# Patient Record
Sex: Female | Born: 1960 | Race: White | Hispanic: No | Marital: Married | Smoking: Never smoker
Health system: Southern US, Community
[De-identification: ages and names within clinical notes are randomized; demographics above are authoritative.]

## PROBLEM LIST (undated history)

## (undated) DIAGNOSIS — C73 Malignant neoplasm of thyroid gland: Secondary | ICD-10-CM

## (undated) HISTORY — DX: Malignant neoplasm of thyroid gland: C73

---

## 2006-01-10 ENCOUNTER — Other Ambulatory Visit: Admission: RE | Admit: 2006-01-10 | Discharge: 2006-01-10 | Payer: Self-pay | Admitting: Family Medicine

## 2006-02-06 ENCOUNTER — Encounter (INDEPENDENT_AMBULATORY_CARE_PROVIDER_SITE_OTHER): Payer: Self-pay | Admitting: *Deleted

## 2006-02-06 ENCOUNTER — Other Ambulatory Visit: Admission: RE | Admit: 2006-02-06 | Discharge: 2006-02-06 | Payer: Self-pay | Admitting: Interventional Radiology

## 2006-02-06 ENCOUNTER — Encounter: Admission: RE | Admit: 2006-02-06 | Discharge: 2006-02-06 | Payer: Self-pay | Admitting: General Surgery

## 2008-10-20 ENCOUNTER — Encounter: Admission: RE | Admit: 2008-10-20 | Discharge: 2008-10-20 | Payer: Self-pay | Admitting: Internal Medicine

## 2008-12-23 ENCOUNTER — Encounter (INDEPENDENT_AMBULATORY_CARE_PROVIDER_SITE_OTHER): Payer: Self-pay | Admitting: Interventional Radiology

## 2008-12-23 ENCOUNTER — Encounter: Admission: RE | Admit: 2008-12-23 | Discharge: 2008-12-23 | Payer: Self-pay | Admitting: Internal Medicine

## 2008-12-23 ENCOUNTER — Other Ambulatory Visit: Admission: RE | Admit: 2008-12-23 | Discharge: 2008-12-23 | Payer: Self-pay | Admitting: Interventional Radiology

## 2010-01-19 ENCOUNTER — Observation Stay (HOSPITAL_COMMUNITY): Admission: RE | Admit: 2010-01-19 | Discharge: 2010-01-20 | Payer: Self-pay | Admitting: Surgery

## 2010-01-19 ENCOUNTER — Encounter (INDEPENDENT_AMBULATORY_CARE_PROVIDER_SITE_OTHER): Payer: Self-pay | Admitting: Surgery

## 2010-04-19 ENCOUNTER — Encounter (HOSPITAL_COMMUNITY): Admission: RE | Admit: 2010-04-19 | Discharge: 2010-07-01 | Payer: Self-pay | Admitting: Internal Medicine

## 2010-10-10 DIAGNOSIS — C73 Malignant neoplasm of thyroid gland: Secondary | ICD-10-CM

## 2010-10-10 HISTORY — PX: TOTAL THYROIDECTOMY: SHX2547

## 2010-10-10 HISTORY — DX: Malignant neoplasm of thyroid gland: C73

## 2010-10-31 ENCOUNTER — Encounter: Payer: Self-pay | Admitting: Internal Medicine

## 2010-11-05 ENCOUNTER — Encounter
Admission: RE | Admit: 2010-11-05 | Discharge: 2010-11-05 | Payer: Self-pay | Source: Home / Self Care | Attending: Internal Medicine | Admitting: Internal Medicine

## 2010-11-12 ENCOUNTER — Other Ambulatory Visit (HOSPITAL_COMMUNITY): Payer: Self-pay | Admitting: Internal Medicine

## 2010-11-12 DIAGNOSIS — C73 Malignant neoplasm of thyroid gland: Secondary | ICD-10-CM

## 2010-12-13 ENCOUNTER — Ambulatory Visit (HOSPITAL_COMMUNITY)
Admission: RE | Admit: 2010-12-13 | Discharge: 2010-12-13 | Disposition: A | Discharge status: Home / Self Care | Payer: BC Managed Care – PPO | Source: Ambulatory Visit | Attending: Internal Medicine | Admitting: Internal Medicine

## 2010-12-13 DIAGNOSIS — C73 Malignant neoplasm of thyroid gland: Secondary | ICD-10-CM

## 2010-12-13 DIAGNOSIS — Z85850 Personal history of malignant neoplasm of thyroid: Secondary | ICD-10-CM | POA: Insufficient documentation

## 2010-12-14 ENCOUNTER — Ambulatory Visit (HOSPITAL_COMMUNITY)
Admission: RE | Admit: 2010-12-14 | Discharge: 2010-12-14 | Disposition: A | Discharge status: Home / Self Care | Payer: BC Managed Care – PPO | Source: Ambulatory Visit | Attending: Internal Medicine | Admitting: Internal Medicine

## 2010-12-15 ENCOUNTER — Ambulatory Visit (HOSPITAL_COMMUNITY): Payer: BC Managed Care – PPO | Attending: Internal Medicine

## 2010-12-17 ENCOUNTER — Ambulatory Visit (HOSPITAL_COMMUNITY)
Admission: RE | Admit: 2010-12-17 | Discharge: 2010-12-17 | Disposition: A | Discharge status: Home / Self Care | Payer: BC Managed Care – PPO | Source: Ambulatory Visit | Attending: Internal Medicine | Admitting: Internal Medicine

## 2010-12-17 DIAGNOSIS — Z85850 Personal history of malignant neoplasm of thyroid: Secondary | ICD-10-CM | POA: Insufficient documentation

## 2010-12-17 MED ORDER — SODIUM IODIDE I 131 CAPSULE
4.0000 | Freq: Once | INTRAVENOUS | Status: AC | PRN
  Administered 2010-12-16: 4 via ORAL

## 2010-12-29 LAB — CALCIUM: Calcium: 8.9 mg/dL (ref 8.4–10.5)

## 2010-12-29 LAB — CBC
HCT: 39.2 % (ref 36.0–46.0)
Hemoglobin: 13.4 g/dL (ref 12.0–15.0)
WBC: 5.8 10*3/uL (ref 4.0–10.5)

## 2010-12-29 LAB — DIFFERENTIAL
Eosinophils Relative: 1 % (ref 0–5)
Lymphocytes Relative: 33 % (ref 12–46)
Lymphs Abs: 1.9 10*3/uL (ref 0.7–4.0)
Monocytes Absolute: 0.4 10*3/uL (ref 0.1–1.0)

## 2010-12-29 LAB — URINALYSIS, ROUTINE W REFLEX MICROSCOPIC
Bilirubin Urine: NEGATIVE
Glucose, UA: NEGATIVE mg/dL
Hgb urine dipstick: NEGATIVE
Specific Gravity, Urine: 1.015 (ref 1.005–1.030)
Urobilinogen, UA: 0.2 mg/dL (ref 0.0–1.0)

## 2010-12-29 LAB — BASIC METABOLIC PANEL
Chloride: 106 mEq/L (ref 96–112)
GFR calc non Af Amer: >60 mL/min (ref >60)
Glucose, Bld: 89 mg/dL (ref 70–99)
Potassium: 4 mEq/L (ref 3.5–5.1)
Sodium: 139 mEq/L (ref 135–145)

## 2011-03-09 ENCOUNTER — Other Ambulatory Visit: Payer: Self-pay | Admitting: Internal Medicine

## 2011-03-09 DIAGNOSIS — C73 Malignant neoplasm of thyroid gland: Secondary | ICD-10-CM

## 2011-06-22 ENCOUNTER — Ambulatory Visit
Admission: RE | Admit: 2011-06-22 | Discharge: 2011-06-22 | Disposition: A | Discharge status: Home / Self Care | Payer: BC Managed Care – PPO | Source: Ambulatory Visit | Attending: Internal Medicine | Admitting: Internal Medicine

## 2011-06-22 DIAGNOSIS — C73 Malignant neoplasm of thyroid gland: Secondary | ICD-10-CM

## 2011-07-08 ENCOUNTER — Other Ambulatory Visit: Payer: Self-pay | Admitting: Internal Medicine

## 2011-07-08 DIAGNOSIS — E041 Nontoxic single thyroid nodule: Secondary | ICD-10-CM

## 2011-07-19 ENCOUNTER — Ambulatory Visit
Admission: RE | Admit: 2011-07-19 | Discharge: 2011-07-19 | Disposition: A | Discharge status: Home / Self Care | Payer: BC Managed Care – PPO | Source: Ambulatory Visit | Attending: Internal Medicine | Admitting: Internal Medicine

## 2011-07-19 ENCOUNTER — Other Ambulatory Visit (HOSPITAL_COMMUNITY)
Admission: RE | Admit: 2011-07-19 | Discharge: 2011-07-19 | Disposition: A | Discharge status: Home / Self Care | Payer: BC Managed Care – PPO | Source: Ambulatory Visit | Attending: Interventional Radiology | Admitting: Interventional Radiology

## 2011-07-19 DIAGNOSIS — E049 Nontoxic goiter, unspecified: Secondary | ICD-10-CM | POA: Insufficient documentation

## 2011-07-19 DIAGNOSIS — E041 Nontoxic single thyroid nodule: Secondary | ICD-10-CM

## 2011-07-19 IMAGING — US US THYROID BIOPSY
1 series · 12 of 12 positions shown · non-contrast
Comparison: none

CLINICAL DATA: Residual/recurrent tissue in the left thyroidectomy
bed.

ULTRASOUND-GUIDED THYROID ASPIRATION BIOPSY
TECHNIQUE: Survey ultrasound was performed and the
residual/recurrent tissue in the left thyroidectomy bed was
localized.  An appropriate skin entry site was determined.  Skin
was marked, then prepped with Betadine, draped in usual sterile
fashion, and infiltrated locally with 1% lidocaine.  Under real-
time ultrasound guidance, 4  passes were made into the region with
25 gauge needles.  The patient tolerated procedure well, with no
immediate complications.
IMPRESSION
1.  Technically successful ultrasound-guided left thyroidectomy bed
aspiration biopsy

[Series 1: us thyroid biopsy · 0.08mm/px · 12 acquisitions, 12 frames shown]
[im 1/12]
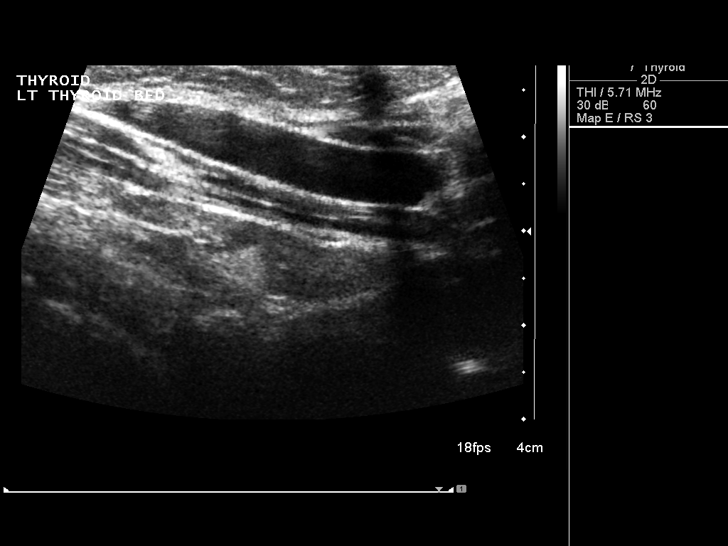
[im 2/12]
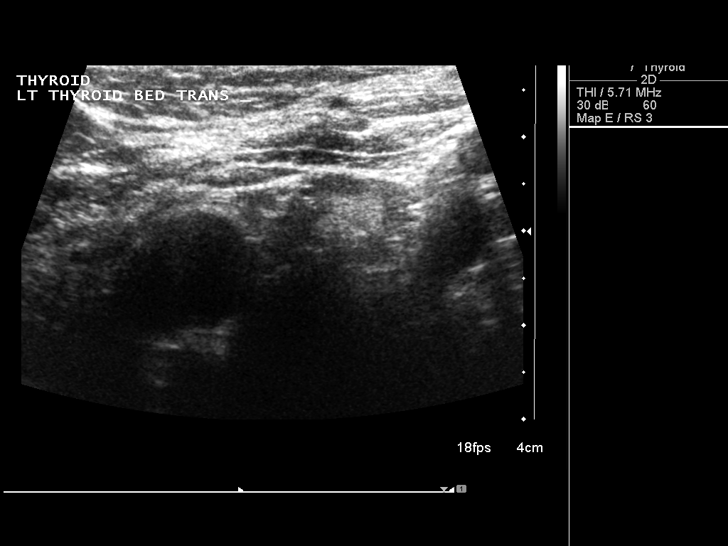
[im 3/12]
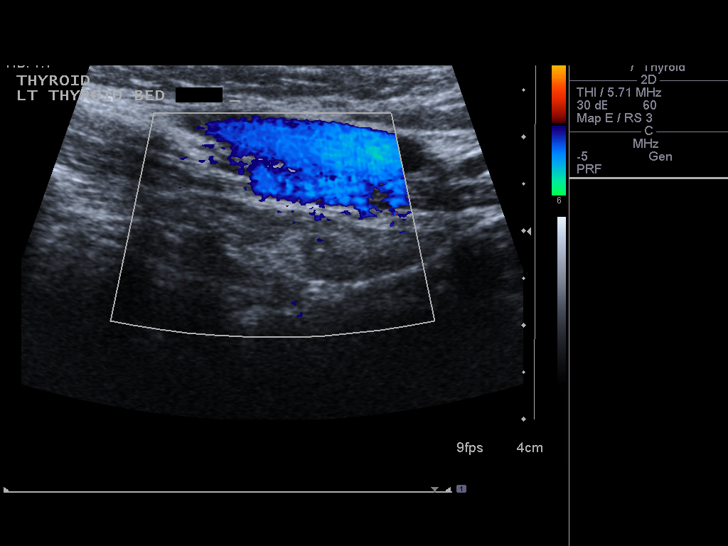
[im 4/12]
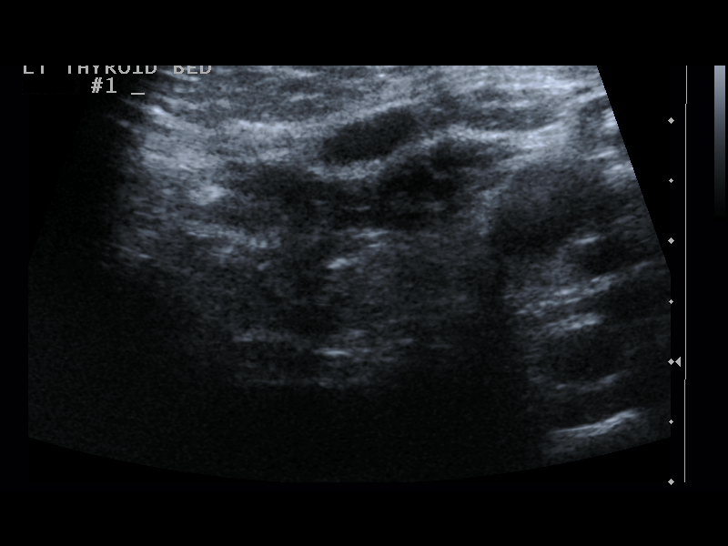
[im 5/12]
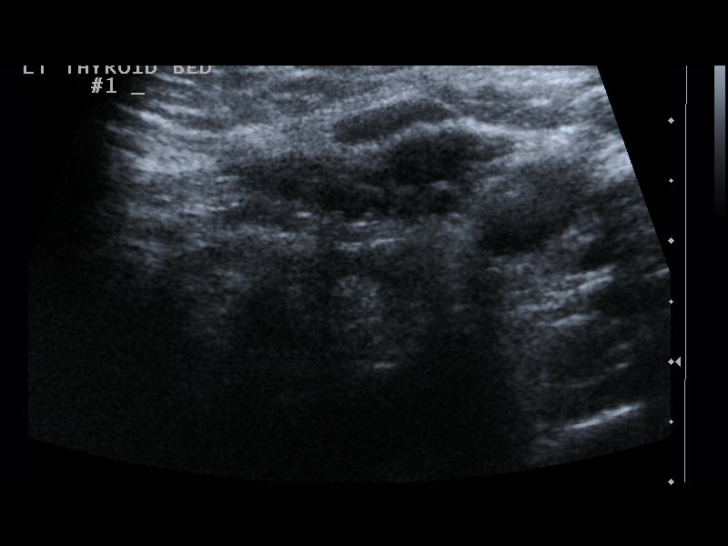
[im 6/12]
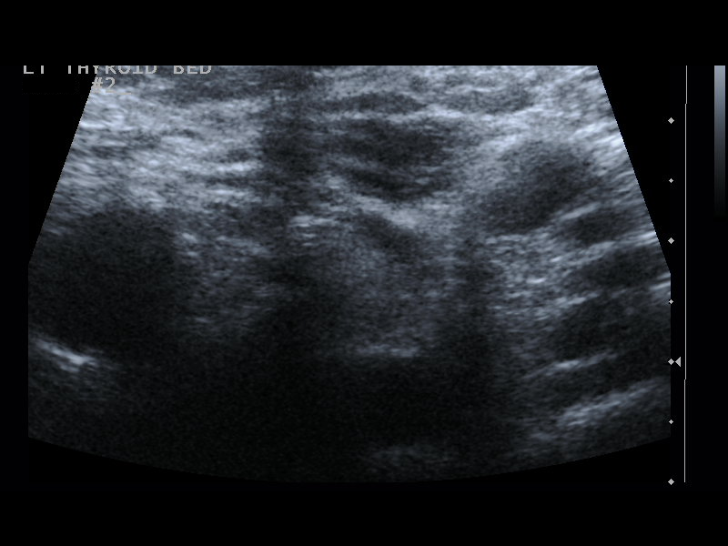
[im 7/12]
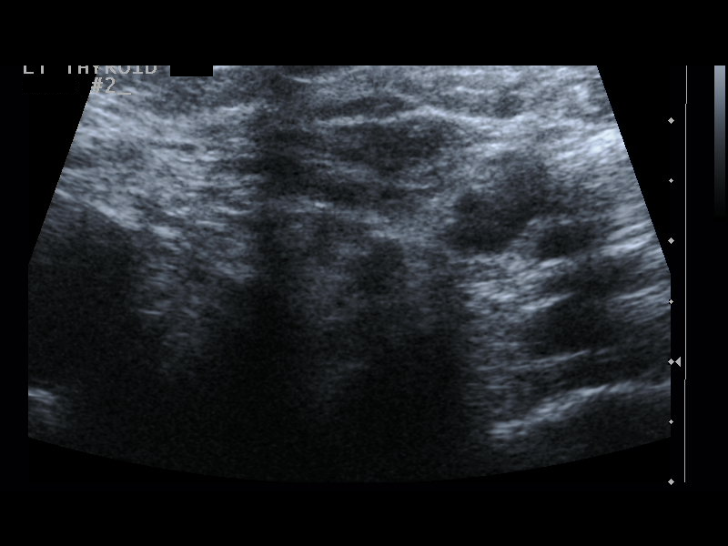
[im 8/12]
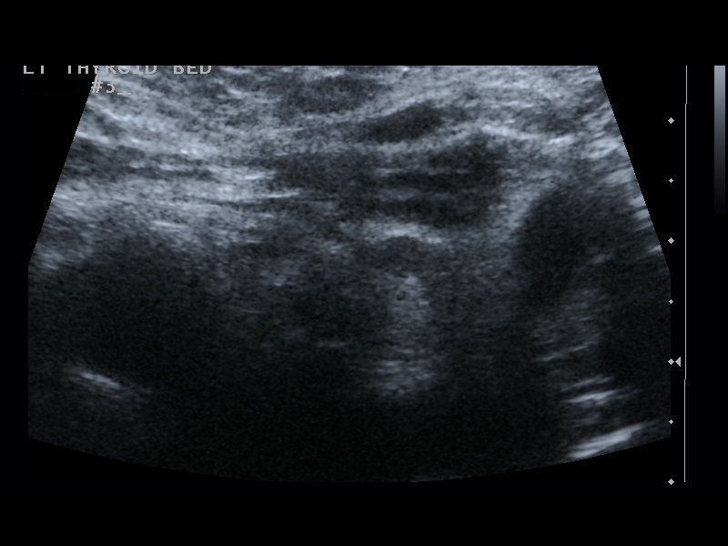
[im 9/12]
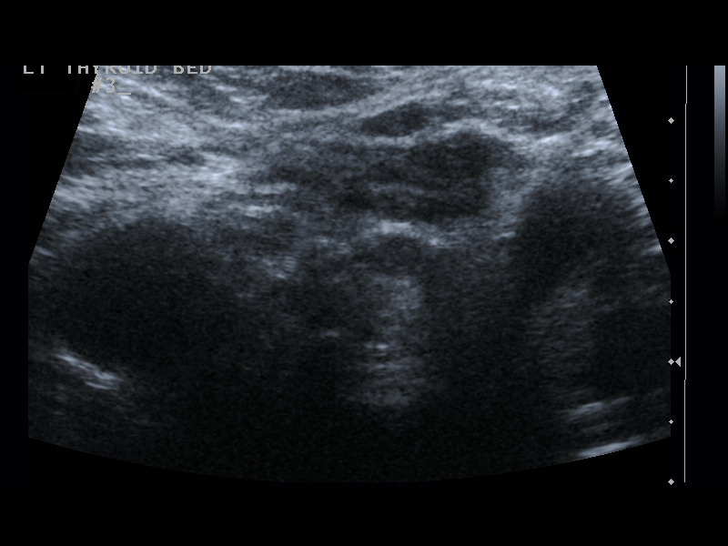
[im 10/12]
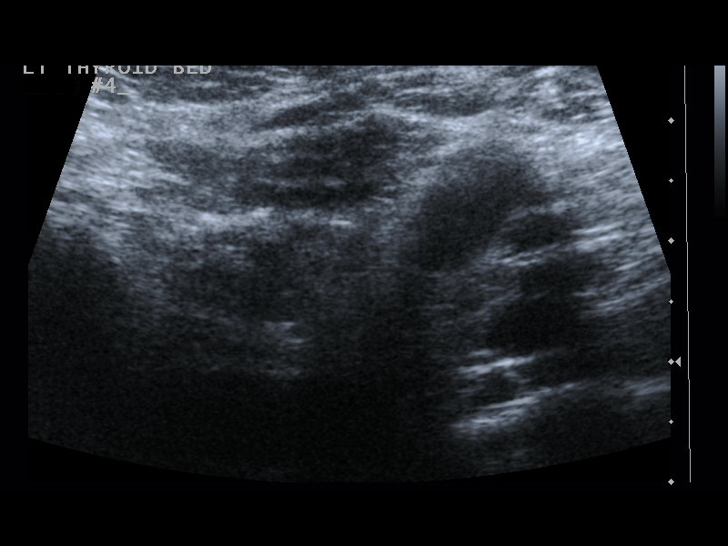
[im 11/12]
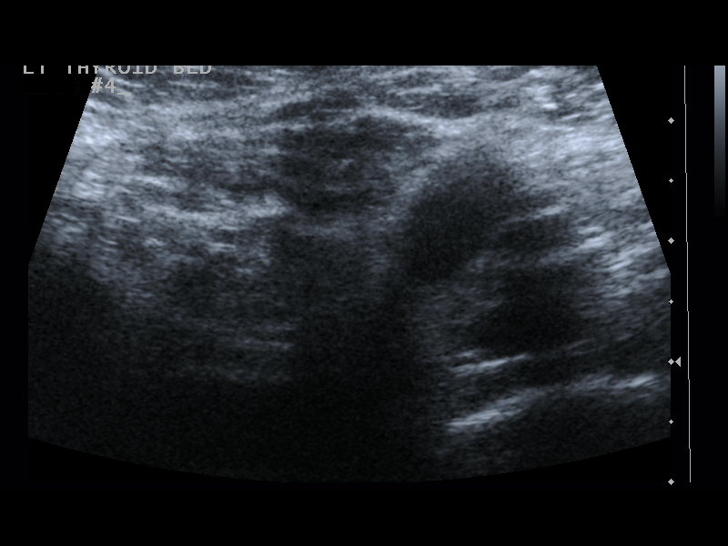
[im 12/12]
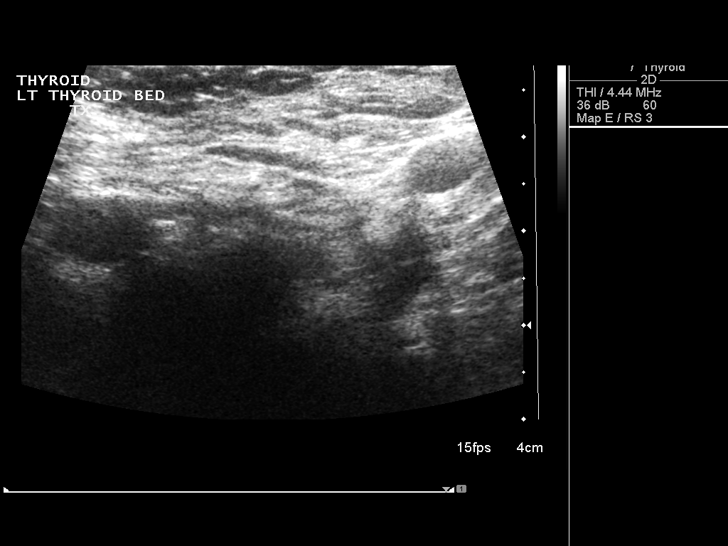

[12 of 12 positions shown; findings below may reference images not displayed]

## 2011-08-04 ENCOUNTER — Telehealth (INDEPENDENT_AMBULATORY_CARE_PROVIDER_SITE_OTHER): Payer: Self-pay

## 2011-08-04 NOTE — Telephone Encounter (Signed)
Return call to patient in hopes of getting more information re: ultrasound.  Left message for patient to please ask for me or leave more detail information about the matter.

## 2011-08-17 ENCOUNTER — Encounter (INDEPENDENT_AMBULATORY_CARE_PROVIDER_SITE_OTHER): Payer: Self-pay | Admitting: Surgery

## 2011-08-17 DIAGNOSIS — C73 Malignant neoplasm of thyroid gland: Secondary | ICD-10-CM | POA: Insufficient documentation

## 2012-03-08 ENCOUNTER — Other Ambulatory Visit: Payer: Self-pay | Admitting: Internal Medicine

## 2012-03-08 DIAGNOSIS — C73 Malignant neoplasm of thyroid gland: Secondary | ICD-10-CM

## 2012-03-09 ENCOUNTER — Ambulatory Visit
Admission: RE | Admit: 2012-03-09 | Discharge: 2012-03-09 | Disposition: A | Discharge status: Home / Self Care | Payer: BC Managed Care – PPO | Source: Ambulatory Visit | Attending: Internal Medicine | Admitting: Internal Medicine

## 2012-03-09 DIAGNOSIS — C73 Malignant neoplasm of thyroid gland: Secondary | ICD-10-CM

## 2012-03-28 ENCOUNTER — Encounter (INDEPENDENT_AMBULATORY_CARE_PROVIDER_SITE_OTHER): Payer: Self-pay | Admitting: Surgery

## 2012-09-24 ENCOUNTER — Other Ambulatory Visit: Payer: Self-pay | Admitting: Internal Medicine

## 2012-09-24 DIAGNOSIS — R221 Localized swelling, mass and lump, neck: Secondary | ICD-10-CM

## 2012-10-02 ENCOUNTER — Ambulatory Visit
Admission: RE | Admit: 2012-10-02 | Discharge: 2012-10-02 | Disposition: A | Discharge status: Home / Self Care | Payer: BC Managed Care – PPO | Source: Ambulatory Visit | Attending: Internal Medicine | Admitting: Internal Medicine

## 2012-10-02 DIAGNOSIS — R221 Localized swelling, mass and lump, neck: Secondary | ICD-10-CM

## 2012-10-11 ENCOUNTER — Telehealth (INDEPENDENT_AMBULATORY_CARE_PROVIDER_SITE_OTHER): Payer: Self-pay

## 2012-10-11 NOTE — Telephone Encounter (Signed)
Per Dr Ardine Eng request pt notified of u/s result.

## 2013-07-23 ENCOUNTER — Other Ambulatory Visit: Payer: Self-pay | Admitting: Internal Medicine

## 2013-07-23 DIAGNOSIS — C73 Malignant neoplasm of thyroid gland: Secondary | ICD-10-CM

## 2013-08-08 ENCOUNTER — Ambulatory Visit
Admission: RE | Admit: 2013-08-08 | Discharge: 2013-08-08 | Disposition: A | Discharge status: Home / Self Care | Payer: BC Managed Care – PPO | Source: Ambulatory Visit | Attending: Internal Medicine | Admitting: Internal Medicine

## 2013-08-08 DIAGNOSIS — C73 Malignant neoplasm of thyroid gland: Secondary | ICD-10-CM

## 2014-09-09 ENCOUNTER — Other Ambulatory Visit: Payer: Self-pay | Admitting: Internal Medicine

## 2014-09-09 DIAGNOSIS — C73 Malignant neoplasm of thyroid gland: Secondary | ICD-10-CM

## 2014-09-29 ENCOUNTER — Ambulatory Visit
Admission: RE | Admit: 2014-09-29 | Discharge: 2014-09-29 | Disposition: A | Discharge status: Home / Self Care | Source: Ambulatory Visit | Attending: Internal Medicine | Admitting: Internal Medicine

## 2014-09-29 DIAGNOSIS — C73 Malignant neoplasm of thyroid gland: Secondary | ICD-10-CM

## 2016-05-23 ENCOUNTER — Ambulatory Visit (INDEPENDENT_AMBULATORY_CARE_PROVIDER_SITE_OTHER): Admitting: Neurology

## 2016-05-23 ENCOUNTER — Telehealth: Payer: Self-pay | Admitting: Neurology

## 2016-05-23 ENCOUNTER — Encounter: Payer: Self-pay | Admitting: Neurology

## 2016-05-23 VITALS — BP 138/70 | HR 88 | Ht 63.0 in | Wt 196.0 lb

## 2016-05-23 DIAGNOSIS — G4441 Drug-induced headache, not elsewhere classified, intractable: Secondary | ICD-10-CM | POA: Diagnosis not present

## 2016-05-23 DIAGNOSIS — G43009 Migraine without aura, not intractable, without status migrainosus: Secondary | ICD-10-CM

## 2016-05-23 DIAGNOSIS — G4440 Drug-induced headache, not elsewhere classified, not intractable: Secondary | ICD-10-CM

## 2016-05-23 MED ORDER — TOPIRAMATE 50 MG PO TABS: 50.0000 mg | ORAL_TABLET | Freq: Every day | ORAL | 2 refills | Status: DC

## 2016-05-23 MED ORDER — SUMATRIPTAN SUCCINATE 100 MG PO TABS: ORAL_TABLET | ORAL | 2 refills | Status: DC

## 2016-05-23 NOTE — Progress Notes (Signed)
NEUROLOGY CONSULTATION NOTE  Margaret Alexander MRN: [id number] DOB: 1960-12-24  Referring provider: Dr. Moreen Fowler Primary care provider: Dr. Moreen Fowler  Reason for consult:  headache  HISTORY OF PRESENT ILLNESS: Margaret Alexander is a 55 year old right-handed woman who presents for migraine.  History obtained by patient and PCP note.  She had migraines in her 80s.  At that time, she took a beta blocker daily and used Cafergot as needed.  They subsequently resolved.  At that time, she was told to stop Excedrin and instead use Advil.  She has been taking Advil almost daily for many years.  She has a dull frontal headache about 20 days per month.  In early July 2017, she began experiencing a new posterior headache.  It is in the right occipital region, pounding, 6/10 intensity.  It is not associated with nausea, photophobia, phonophobia or visual disturbance.  It lasts all day and occurs about 6 to 8 days per week.  She has been taking Advil.  She also takes benadryl to help her sleep.  There is no known trigger.  Heat or cold relieves it.  She denies neck pain or anything that triggered it. She can usually function with it.  Past NSAIDS:  no Past analgesics:  Excedrin, Cafergot Past abortive triptans:  no Past muscle relaxants:  no Past anti-emetic:  no Past anti-anxiolytic:  no Past sleep aide:  Advil PM Past antihypertensive medications:  A beta blocker years ago Past antidepressant medications:  no Past anticonvulsant medications:  no Past vitamins/Herbal/Supplements:  no Past antihistamines/decongestants: no Other past medications:  no  Caffeine:  daily Alcohol:  1 glass of wine daily Smoker:  no Diet:  Needs to increase water Exercise:  no Depression/stress:  no Sleep hygiene:  poor Family history of headache:  mom  Labs 04/25/16: CMP:  Na 139, K 4.2, Cl 104, glucose 88, BUN 14, Cr 0.66, TP 7.1, TB 0.4, ALP 66, AST 11, ALT 14 TSH 0.05, free T4 1.54, thyroblobulin antibody  negative  PAST MEDICAL HISTORY: Past Medical History:  Diagnosis Date  . Thyroid cancer ([place])     PAST SURGICAL HISTORY: History reviewed. No pertinent surgical history.  MEDICATIONS: No current outpatient prescriptions on file prior to visit.   No current facility-administered medications on file prior to visit.     ALLERGIES: Allergies  Allergen Reactions  . Penicillins     FAMILY HISTORY: Family History  Problem Relation Age of Onset  . Migraines Mother   . Dementia Father     SOCIAL HISTORY: Social History   Social History  . Marital status: Married    Spouse name: N/A  . Number of children: N/A  . Years of education: N/A   Occupational History  . Not on file.   Social History Main Topics  . Smoking status: Never Smoker  . Smokeless tobacco: Never Used  . Alcohol use Not on file  . Drug use: Unknown  . Sexual activity: Not on file   Other Topics Concern  . Not on file   Social History Narrative  . No narrative on file    REVIEW OF SYSTEMS: Constitutional: No fevers, chills, or sweats, no generalized fatigue, change in appetite Eyes: No visual changes, double vision, eye pain Ear, nose and throat: No hearing loss, ear pain, nasal congestion, sore throat Cardiovascular: No chest pain, palpitations Respiratory:  No shortness of breath at rest or with exertion, wheezes GastrointestinaI: No nausea, vomiting, diarrhea, abdominal pain, fecal incontinence Genitourinary:  No dysuria, urinary retention or frequency Musculoskeletal:  No neck pain, back pain Integumentary: No rash, pruritus, skin lesions Neurological: as above Psychiatric: No depression, insomnia, anxiety Endocrine: No palpitations, fatigue, diaphoresis, mood swings, change in appetite, change in weight, increased thirst Hematologic/Lymphatic:  No purpura, petechiae. Allergic/Immunologic: no itchy/runny eyes, nasal congestion, recent allergic reactions, rashes  PHYSICAL EXAM: Vitals:     05/23/16 1237  BP: 138/70  Pulse: 88   General: No acute distress.  Patient appears well-groomed.  Head:  Normocephalic/atraumatic.  Right suboccipital tenderness to palpation Eyes:  fundi examined but not visualized Neck: supple, no paraspinal tenderness, full range of motion Back: No paraspinal tenderness Heart: regular rate and rhythm Lungs: Clear to auscultation bilaterally. Vascular: No carotid bruits. Neurological Exam: Mental status: alert and oriented to person, place, and time, recent and remote memory intact, fund of knowledge intact, attention and concentration intact, speech fluent and not dysarthric, language intact. Cranial nerves: CN I: not tested CN II: pupils equal, round and reactive to light, visual fields intact CN III, IV, VI:  full range of motion, no nystagmus, no ptosis CN V: facial sensation intact CN VII: upper and lower face symmetric CN VIII: hearing intact CN IX, X: gag intact, uvula midline CN XI: sternocleidomastoid and trapezius muscles intact CN XII: tongue midline Bulk & Tone: normal, no fasciculations. Motor:  5/5 throughout Sensation: temperature and vibration sensation intact. Deep Tendon Reflexes:  2+ throughout, toes downgoing. Finger to nose testing:  Without dysmetria.  Heel to shin:  Without dysmetria.  Gait:  Normal station and stride.  Able to turn and tandem walk. Romberg negative.  IMPRESSION: 1.  Probable migraine, possibly cervicogenic 2.  Medication overuse headache  PLAN: 1.  Will start topiramate 50mg  at bedtime and she will contact us in 4 weeks with update.  Side effects discussed. 2.  She will stop Advil.  Instead, she will try sumatriptan 100mg , limited to no more than 2 days out of the week 3.  Lifestyle modification discussed 4.  Follow up in 3 months.  Thank you for allowing me to take part in the care of this patient.  Metta Clines, DO  CC:  Antony Contras, MD

## 2016-05-23 NOTE — Patient Instructions (Signed)
Migraine Recommendations: 1.  Start topiramate 50mg  at bedtime.  Call in 4 weeks with update and we can adjust dose if needed. Possible side effects include: impaired thinking, sedation, paresthesias (numbness and tingling) and weight loss.  It may cause dehydration and there is a small risk for kidney stones, so make sure to stay hydrated with water during the day.  There is also a very small risk for glaucoma, so if you notice any change in your vision while taking this medication, see an ophthalmologist.    2.  Take sumatriptan 100mg  at earliest onset of headache.  May repeat dose once in 2 hours if needed.  Do not exceed two tablets in 24 hours. 3.  STOP Advil.  Limit use of pain relievers to no more than 2 days out of the week.  These medications include acetaminophen, ibuprofen, triptans and narcotics.  This will help reduce risk of rebound headaches. 4.  Be aware of common food triggers such as processed sweets, processed foods with nitrites (such as deli meat, hot dogs, sausages), foods with MSG, alcohol (such as wine), chocolate, certain cheeses, certain fruits (dried fruits, some citrus fruit), vinegar, diet soda. 4.  Avoid caffeine 5.  Routine exercise 6.  Proper sleep hygiene 7.  Stay adequately hydrated with water 8.  Keep a headache diary. 9.  Maintain proper stress management. 10.  Do not skip meals. 11.  Consider supplements:  Magnesium oxide 400mg  to 600mg  daily, riboflavin 400mg , Coenzyme Q 10 100mg  three times daily 12.  Contact us in 4 week or prior to leaving on trip, for update and we can adjust dose of medication if needed.

## 2016-05-23 NOTE — Telephone Encounter (Signed)
Sumatriptan now at [facility] as well.

## 2016-05-23 NOTE — Telephone Encounter (Signed)
Margaret Alexander [birth year]. She called in about her medication. She had 2 prescriptions called in for her at [facility] [place] and [place].  She received the Topiramate but not the Sumatriptan. Her # is [phone]. Thank you

## 2016-05-26 ENCOUNTER — Telehealth: Payer: Self-pay | Admitting: Neurology

## 2016-05-26 MED ORDER — PREDNISONE 10 MG PO TABS: ORAL_TABLET | ORAL | 0 refills | Status: DC

## 2016-05-26 NOTE — Telephone Encounter (Signed)
She was given explicit instructions on use of pain relievers and triptans.  She will not be able to get another refill for a month.  She can come in for a headache cocktail (but will need a driver).  Other option is a prednisone taper (10mg  tablets.  Take 6tabs x1day, then 5tabs x1day, then 4tabs x1day, then 3tabs x1day, then 2tabs x1day, then 1tab x1day, then STOP).  Otherwise, if pain is so unbearable, she would need to go to ED.

## 2016-05-26 NOTE — Telephone Encounter (Signed)
Pt aware. Would like RX for prednisone sent to pharmacy. RX sent in.

## 2016-05-26 NOTE — Telephone Encounter (Signed)
PT called in regards to her medication for her migraines and she has taken all her medication and is having severe headaches and can not function/Dawn CB#[phone]

## 2016-05-26 NOTE — Telephone Encounter (Signed)
Called and spoke to patient. Complaints of severe migraine and quantity limit of 9 on her sumatriptan. Pt stated that she has already gone through her 9 sumatriptan tablets she picked up on 05/23/16. Pt was also started on topamax 50 mg QHS on that date. Pt stated her migraine started almost as soon as she left our office that day. Pain is located in back of her skull. Sumatriptan does relive her pain, but not 100%. Pt complains that for the past 2 nights, at around midnight with the back of her skull pounding. Pain so severe pt unable to lay head down on bed or reclining chair. Pt states she doesn't know what to do, because she cannot function with head hurting like this. Advised patient that she cannot be taking any pain relievers 5/7 days. ONLY 2 days can she use anything, to prevent rebound headaches. Pt verbalized understanding, but again stated she didn't know how to live with them. Pt has just recently stopped caffeine, and ordered her supplements, but has not started them yet. Advised that medication will also take 4 weeks for full effectiveness. Please advise.

## 2016-08-25 ENCOUNTER — Ambulatory Visit: Admitting: Neurology

## 2017-02-20 ENCOUNTER — Encounter: Payer: Self-pay | Admitting: Obstetrics & Gynecology

## 2017-02-20 ENCOUNTER — Ambulatory Visit (INDEPENDENT_AMBULATORY_CARE_PROVIDER_SITE_OTHER): Admitting: Obstetrics & Gynecology

## 2017-02-20 VITALS — BP 122/78 | Ht 62.5 in | Wt 199.0 lb

## 2017-02-20 DIAGNOSIS — E669 Obesity, unspecified: Secondary | ICD-10-CM | POA: Diagnosis not present

## 2017-02-20 DIAGNOSIS — Z780 Asymptomatic menopausal state: Secondary | ICD-10-CM

## 2017-02-20 DIAGNOSIS — Z1151 Encounter for screening for human papillomavirus (HPV): Secondary | ICD-10-CM

## 2017-02-20 DIAGNOSIS — N393 Stress incontinence (female) (male): Secondary | ICD-10-CM

## 2017-02-20 DIAGNOSIS — Z01419 Encounter for gynecological examination (general) (routine) without abnormal findings: Secondary | ICD-10-CM

## 2017-02-20 NOTE — Progress Notes (Signed)
SHRILEY JOFFE 08/06/1961 [id number]   History:    56 y.o. G0 Married.  Abstinent.  Retired.  RP:  New patient presenting for annual gyn exam   Menopause.  No HRT.  No PMB.  No pelvic pain.  No vaginal d/c.  C/O mild SUI.  Worst with vomiting or coughing.  Some with efforts while gardening.  No Urgency.  Cut on caffeine products.  Breasts wnl.  Past medical history,surgical history, family history and social history were all reviewed and documented in the EPIC chart.  Gynecologic History No LMP recorded. Patient is postmenopausal. Contraception: abstinence and post menopausal status Last Pap: 10 yrs ago. Results were: normal per patient Last mammogram: 2 years ago. Results were: normal per patient  Obstetric History OB History  Gravida Para Term Preterm AB Living  0 0 0 0 0 0  SAB TAB Ectopic Multiple Live Births  0 0 0 0 0         ROS: A ROS was performed and pertinent positives and negatives are included in the history.  GENERAL: No fevers or chills. HEENT: No change in vision, no earache, sore throat or sinus congestion. NECK: No pain or stiffness. CARDIOVASCULAR: No chest pain or pressure. No palpitations. PULMONARY: No shortness of breath, cough or wheeze. GASTROINTESTINAL: No abdominal pain, nausea, vomiting or diarrhea, melena or bright red blood per rectum. GENITOURINARY: No urinary frequency, urgency, hesitancy or dysuria. MUSCULOSKELETAL: No joint or muscle pain, no back pain, no recent trauma. DERMATOLOGIC: No rash, no itching, no lesions. ENDOCRINE: No polyuria, polydipsia, no heat or cold intolerance. No recent change in weight. HEMATOLOGICAL: No anemia or easy bruising or bleeding. NEUROLOGIC: No headache, seizures, numbness, tingling or weakness. PSYCHIATRIC: No depression, no loss of interest in normal activity or change in sleep pattern.     Exam:   BP 122/78   Ht 5' 2.5" (1.588 m)   Wt 199 lb (90.3 kg)   BMI 35.82 kg/m   Body mass index is 35.82  kg/m.  General appearance : Well developed well nourished female. No acute distress HEENT: Eyes: no retinal hemorrhage or exudates,  Neck supple, trachea midline, no carotid bruits, no thyroidmegaly Lungs: Clear to auscultation, no rhonchi or wheezes, or rib retractions  Heart: Regular rate and rhythm, no murmurs or gallops Breast:Examined in sitting and supine position were symmetrical in appearance, no palpable masses or tenderness,  no skin retraction, no nipple inversion, no nipple discharge, no skin discoloration, no axillary or supraclavicular lymphadenopathy Abdomen: no palpable masses or tenderness, no rebound or guarding Extremities: no edema or skin discoloration or tenderness  Pelvic:  Bartholin, Urethra, Skene Glands: Within normal limits             Vagina: No gross lesions or discharge.  No Cysto-rectocele.  Cervix: No gross lesions or discharge.  Pap/HPV HR done.  Uterus  AV, normal size, shape and consistency, non-tender and mobile.  No prolapse.  Adnexa  Without masses or tenderness  Anus and perineum  normal    Assessment/Plan:  56 y.o. female for annual exam   1. Encounter for routine gynecological examination with Papanicolaou smear of cervix Normal Gyn exam.  Pap/HPV HR pending.  Will schedule screening Mammo.  2. Special screening examination for human papillomavirus (HPV)  - PAP,TP IMGw/HPV RNA,rflx [id number],50/56  3. Menopause present No HRT.  ASxic.  No PMB.  4. SUI (stress urinary incontinence, female) Mild SUI.  Recommendations discussed.  Avoid Pelvic floor pressure, treat cough, constipation,  vomitting.  Start doing [employer] , instructions discussed.  Weight loss program with [employer] and physical activity.  If no improvement, will refer to Physical therapy.  TVT/Sling procedures briefly discussed.  5. Obesity (BMI 35.0-39.9 without comorbidity) Wt loss strongly recommended with Low carb diet and physical activity.  Counseling on above issues  >50% x 20 minutes.  Princess Bruins MD, 2:12 PM 02/20/2017

## 2017-02-20 NOTE — Patient Instructions (Signed)
1. Encounter for routine gynecological examination with Papanicolaou smear of cervix Normal Gyn exam.  Pap/HPV HR pending.  Will schedule screening Mammo.  2. Special screening examination for human papillomavirus (HPV)  - PAP,TP IMGw/HPV RNA,rflx [id number],12/45  3. Menopause present No HRT.  ASxic.  No PMB.  4. SUI (stress urinary incontinence, female) Mild SUI.  Recommendations discussed.  Avoid Pelvic floor pressure, treat cough, constipation, vomitting.  Start doing [employer] , instructions discussed.  Weight loss program with [employer] and physical activity.  If no improvement, will refer to Physical therapy.  TVT/Sling procedures briefly discussed.  5. Obesity (BMI 35.0-39.9 without comorbidity) Wt loss strongly recommended with Low carb diet and physical activity.  Margaret Alexander, it was a pleasure to meet you today!  I will inform you as soon as your results are available.  Below, you will find the information about the Kegel exercises.  Good luck!   Kegel Exercises Kegel exercises help strengthen the muscles that support the rectum, vagina, small intestine, bladder, and uterus. Doing Kegel exercises can help:  Improve bladder and bowel control.  Improve sexual response.  Reduce problems and discomfort during pregnancy. Kegel exercises involve squeezing your pelvic floor muscles, which are the same muscles you squeeze when you try to stop the flow of urine. The exercises can be done while sitting, standing, or lying down, but it is best to vary your position. Phase 1 exercises 1. Squeeze your pelvic floor muscles tight. You should feel a tight lift in your rectal area. If you are a female, you should also feel a tightness in your vaginal area. Keep your stomach, buttocks, and legs relaxed. 2. Hold the muscles tight for up to 10 seconds. 3. Relax your muscles. Repeat this exercise 50 times a day or as many times as told by your health care provider. Continue to do this exercise  for at least 4-6 weeks or for as long as told by your health care provider. This information is not intended to replace advice given to you by your health care provider. Make sure you discuss any questions you have with your health care provider. Document Released: 09/12/2012 Document Revised: 05/21/2016 Document Reviewed: 08/16/2015 Elsevier Interactive Patient Education  2017 [employer].

## 2017-02-21 LAB — PAP, TP IMAGING W/ HPV RNA, RFLX HPV TYPE 16,18/45: HPV mRNA, High Risk: NOT DETECTED

## 2017-02-24 ENCOUNTER — Encounter: Payer: Self-pay | Admitting: *Deleted

## 2017-02-28 ENCOUNTER — Encounter: Payer: Self-pay | Admitting: Obstetrics & Gynecology

## 2017-05-19 ENCOUNTER — Telehealth: Payer: Self-pay | Admitting: *Deleted

## 2017-05-19 NOTE — Telephone Encounter (Signed)
Pt left message in triage voicemail c/o nasuea and headache with Cipro, I called pt back and received her voicemail I left message telling her I didn't see where Dr.Lavoie prescribed Cipro and best to follow-up with the provider who prescribed Rx due to symptoms.

## 2020-05-08 ENCOUNTER — Other Ambulatory Visit: Payer: Self-pay | Admitting: Internal Medicine

## 2020-05-08 ENCOUNTER — Ambulatory Visit
Admission: RE | Admit: 2020-05-08 | Discharge: 2020-05-08 | Disposition: A | Discharge status: Home / Self Care | Source: Ambulatory Visit | Attending: Internal Medicine | Admitting: Internal Medicine

## 2020-05-08 DIAGNOSIS — R059 Cough, unspecified: Secondary | ICD-10-CM

## 2020-08-31 ENCOUNTER — Ambulatory Visit (INDEPENDENT_AMBULATORY_CARE_PROVIDER_SITE_OTHER): Admitting: Pulmonary Disease

## 2020-08-31 ENCOUNTER — Encounter: Payer: Self-pay | Admitting: Pulmonary Disease

## 2020-08-31 ENCOUNTER — Other Ambulatory Visit: Payer: Self-pay

## 2020-08-31 VITALS — BP 130/90 | HR 85 | Temp 98.0°F | Ht 63.0 in | Wt 203.8 lb

## 2020-08-31 DIAGNOSIS — R059 Cough, unspecified: Secondary | ICD-10-CM | POA: Diagnosis not present

## 2020-08-31 DIAGNOSIS — R0683 Snoring: Secondary | ICD-10-CM

## 2020-08-31 DIAGNOSIS — F5101 Primary insomnia: Secondary | ICD-10-CM | POA: Diagnosis not present

## 2020-08-31 NOTE — Progress Notes (Signed)
Margaret Alexander    [id number]    [birth year]  Primary Care Physician:Swayne, Shanon Brow, MD  Referring Physician: Antony Contras, MD [address],  [place] [address]  Chief complaint:  Patient with cough  HPI: Patient with a chronic cough -Symptoms persistent for about 2 years -Improved with optimization of PPI  Sleep onset insomnia -She has noticed improved symptoms with increased physical activity as she is currently trying to move -Increase physical activity has definitely impacted her insomnia -She usually uses Tylenol PM and other over-the-counter aids to help her sleep -Spouse snores and this sometimes keeps her up during the night as well  History of snoring -No witnessed apneas -Nonrestorative sleep -Brother has OSA -Mildly sleepy during the day but usually does not take naps -Not concerned about sleep apnea  Never smoker Weight is relatively stable No significant occupational predisposition No significant secondhand smoke exposure  Outpatient Encounter Medications as of 08/31/2020  Medication Sig  . omeprazole (PRILOSEC) 20 MG capsule Take 20 mg by mouth daily.  . pravastatin (PRAVACHOL) 40 MG tablet   . SYNTHROID 150 MCG tablet   . UNABLE TO FIND Med Name: [employer]   No facility-administered encounter medications on file as of 08/31/2020.    Allergies as of 08/31/2020 - Review Complete 02/20/2017  Allergen Reaction Noted  . Penicillins  05/23/2016    Past Medical History:  Diagnosis Date  . Thyroid cancer ([place]) 2012    Past Surgical History:  Procedure Laterality Date  . TOTAL THYROIDECTOMY  2012    Family History  Problem Relation Age of Onset  . Migraines Mother   . Hypertension Mother   . Dementia Father     Social History   Socioeconomic History  . Marital status: Married    Spouse name: Not on file  . Number of children: Not on file  . Years of education: Not on file  . Highest education  level: Not on file  Occupational History  . Not on file  Tobacco Use  . Smoking status: Never Smoker  . Smokeless tobacco: Never Used  Vaping Use  . Vaping Use: Never used  Substance and Sexual Activity  . Alcohol use: Yes    Comment: OCC WINE  . Drug use: No  . Sexual activity: Never  Other Topics Concern  . Not on file  Social History Narrative  . Not on file   Social Determinants of Health   Financial Resource Strain:   . Difficulty of Paying Living Expenses: Not on file  Food Insecurity:   . Worried About Charity fundraiser in the Last Year: Not on file  . Ran Out of Food in the Last Year: Not on file  Transportation Needs:   . Lack of Transportation (Medical): Not on file  . Lack of Transportation (Non-Medical): Not on file  Physical Activity:   . Days of Exercise per Week: Not on file  . Minutes of Exercise per Session: Not on file  Stress:   . Feeling of Stress : Not on file  Social Connections:   . Frequency of Communication with Friends and Family: Not on file  . Frequency of Social Gatherings with Friends and Family: Not on file  . Attends Religious Services: Not on file  . Active Member of Clubs or Organizations: Not on file  . Attends Archivist Meetings: Not on file  . Marital Status: Not on file  Intimate Partner Violence:   . Fear of Current or Ex-Partner: Not on file  . Emotionally Abused: Not on file  . Physically Abused: Not on file  . Sexually Abused: Not on file    Review of Systems  Respiratory: Positive for cough.   Psychiatric/Behavioral: Positive for sleep disturbance.  All other systems reviewed and are negative.   There were no vitals filed for this visit.   Physical Exam Constitutional:      Appearance: She is obese.  HENT:     Nose: No congestion.     Mouth/Throat:     Mouth: Mucous membranes are moist.     Comments: Mallampati 2, crowded oropharynx Eyes:     General:        Right eye: No discharge.        Left  eye: No discharge.  Cardiovascular:     Rate and Rhythm: Normal rate and regular rhythm.     Heart sounds: No murmur heard.  No friction rub.  Pulmonary:     Effort: No respiratory distress.     Breath sounds: No stridor. No wheezing or rhonchi.  Musculoskeletal:     Cervical back: Normal range of motion and neck supple. No rigidity or tenderness.  Psychiatric:        Mood and Affect: Mood normal.    Data Reviewed: Recent chest x-ray reviewed-no significant abnormality noted  Assessment:  Cough -Significant improvement in cough since optimization of PPI -Contributors to cough discussed with patient  Sleep onset insomnia -Symptoms are much better with increased physical activity -Not needing to take sleep aids -Importance of graded activity discussed  Snoring Nonrestorative sleep -Possibility of sleep disordered breathing discussed with the patient -Information material regarding sleep disordered breathing provided  Plan/Recommendations: Clinical monitoring of symptoms  Patient will be moving out of state in about a month -I will be glad to see as needed  Continue graded exercises as tolerated  Seek help regarding possible sleep disordered breathing -Weight loss will definitely help -Increase physical activity will help sleep issues   Sherrilyn Rist MD [facility] 08/31/2020, 9:09 AM  CC: Antony Contras, MD

## 2020-08-31 NOTE — Patient Instructions (Signed)
Cough -Much better with PPI -No other investigation needed  Sleep onset insomnia -Increase physical activity as tolerated  Possible sleep apnea -Increased activity as tolerated -Encourage lateral sleep -Weight loss efforts Sleep Apnea Sleep apnea affects breathing during sleep. It causes breathing to stop for a short time or to become shallow. It can also increase the risk of:  Heart attack.  Stroke.  Being very overweight (obese).  Diabetes.  Heart failure.  Irregular heartbeat. The goal of treatment is to help you breathe normally again. What are the causes? There are three kinds of sleep apnea:  Obstructive sleep apnea. This is caused by a blocked or collapsed airway.  Central sleep apnea. This happens when the brain does not send the right signals to the muscles that control breathing.  Mixed sleep apnea. This is a combination of obstructive and central sleep apnea. The most common cause of this condition is a collapsed or blocked airway. This can happen if:  Your throat muscles are too relaxed.  Your tongue and tonsils are too large.  You are overweight.  Your airway is too small. What increases the risk?  Being overweight.  Smoking.  Having a small airway.  Being older.  Being female.  Drinking alcohol.  Taking medicines to calm yourself (sedatives or tranquilizers).  Having family members with the condition. What are the signs or symptoms?  Trouble staying asleep.  Being sleepy or tired during the day.  Getting angry a lot.  Loud snoring.  Headaches in the morning.  Not being able to focus your mind (concentrate).  Forgetting things.  Less interest in sex.  Mood swings.  Personality changes.  Feelings of sadness (depression).  Waking up a lot during the night to pee (urinate).  Dry mouth.  Sore throat. How is this diagnosed?  Your medical history.  A physical exam.  A test that is done when you are sleeping (sleep  study). The test is most often done in a sleep lab but may also be done at home. How is this treated?   Sleeping on your side.  Using a medicine to get rid of mucus in your nose (decongestant).  Avoiding the use of alcohol, medicines to help you relax, or certain pain medicines (narcotics).  Losing weight, if needed.  Changing your diet.  Not smoking.  Using a machine to open your airway while you sleep, such as: ? An oral appliance. This is a mouthpiece that shifts your lower jaw forward. ? A CPAP device. This device blows air through a mask when you breathe out (exhale). ? An EPAP device. This has valves that you put in each nostril. ? A BPAP device. This device blows air through a mask when you breathe in (inhale) and breathe out.  Having surgery if other treatments do not work. It is important to get treatment for sleep apnea. Without treatment, it can lead to:  High blood pressure.  Coronary artery disease.  In men, not being able to have an erection (impotence).  Reduced thinking ability. Follow these instructions at home: Lifestyle  Make changes that your doctor recommends.  Eat a healthy diet.  Lose weight if needed.  Avoid alcohol, medicines to help you relax, and some pain medicines.  Do not use any products that contain nicotine or tobacco, such as cigarettes, e-cigarettes, and chewing tobacco. If you need help quitting, ask your doctor. General instructions  Take over-the-counter and prescription medicines only as told by your doctor.  If you were given  a machine to use while you sleep, use it only as told by your doctor.  If you are having surgery, make sure to tell your doctor you have sleep apnea. You may need to bring your device with you.  Keep all follow-up visits as told by your doctor. This is important. Contact a doctor if:  The machine that you were given to use during sleep bothers you or does not seem to be working.  You do not get  better.  You get worse. Get help right away if:  Your chest hurts.  You have trouble breathing in enough air.  You have an uncomfortable feeling in your back, arms, or stomach.  You have trouble talking.  One side of your body feels weak.  A part of your face is hanging down. These symptoms may be an emergency. Do not wait to see if the symptoms will go away. Get medical help right away. Call your local emergency services (911 in the [place] Do not drive yourself to the hospital. Summary  This condition affects breathing during sleep.  The most common cause is a collapsed or blocked airway.  The goal of treatment is to help you breathe normally while you sleep. This information is not intended to replace advice given to you by your health care provider. Make sure you discuss any questions you have with your health care provider. Document Revised: 07/13/2018 Document Reviewed: 05/22/2018 [employer] Patient Education  [place].
